# Patient Record
Sex: Female | Born: 1990 | Race: White | Hispanic: No | Marital: Single | Smoking: Current every day smoker
Health system: Southern US, Community
[De-identification: ages and names within clinical notes are randomized; demographics above are authoritative.]

## PROBLEM LIST (undated history)

## (undated) DIAGNOSIS — E669 Obesity, unspecified: Secondary | ICD-10-CM

---
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
## 2008-03-28 ENCOUNTER — Ambulatory Visit: Payer: Self-pay | Admitting: Family Medicine

## 2008-03-28 DIAGNOSIS — F17200 Nicotine dependence, unspecified, uncomplicated: Secondary | ICD-10-CM

## 2008-03-28 DIAGNOSIS — F411 Generalized anxiety disorder: Secondary | ICD-10-CM | POA: Insufficient documentation

## 2008-03-28 DIAGNOSIS — R079 Chest pain, unspecified: Secondary | ICD-10-CM

## 2011-06-07 ENCOUNTER — Emergency Department (HOSPITAL_COMMUNITY)
Admission: EM | Admit: 2011-06-07 | Discharge: 2011-06-08 | Disposition: A | Discharge status: Home / Self Care | Payer: Self-pay | Attending: Emergency Medicine | Admitting: Emergency Medicine

## 2011-06-07 DIAGNOSIS — K089 Disorder of teeth and supporting structures, unspecified: Secondary | ICD-10-CM | POA: Insufficient documentation

## 2011-06-07 DIAGNOSIS — K029 Dental caries, unspecified: Secondary | ICD-10-CM | POA: Insufficient documentation

## 2011-07-23 ENCOUNTER — Emergency Department (HOSPITAL_BASED_OUTPATIENT_CLINIC_OR_DEPARTMENT_OTHER)
Admission: EM | Admit: 2011-07-23 | Discharge: 2011-07-24 | Disposition: A | Discharge status: Home / Self Care | Payer: Self-pay | Attending: Emergency Medicine | Admitting: Emergency Medicine

## 2011-07-23 DIAGNOSIS — E669 Obesity, unspecified: Secondary | ICD-10-CM | POA: Insufficient documentation

## 2011-07-23 DIAGNOSIS — K0889 Other specified disorders of teeth and supporting structures: Secondary | ICD-10-CM

## 2011-07-23 DIAGNOSIS — K089 Disorder of teeth and supporting structures, unspecified: Secondary | ICD-10-CM | POA: Insufficient documentation

## 2011-07-23 DIAGNOSIS — F17200 Nicotine dependence, unspecified, uncomplicated: Secondary | ICD-10-CM | POA: Insufficient documentation

## 2011-07-23 HISTORY — DX: Obesity, unspecified: E66.9

## 2011-07-23 MED ORDER — OXYCODONE-ACETAMINOPHEN 5-325 MG PO TABS: 1.0000 | ORAL_TABLET | ORAL | Status: AC | PRN

## 2011-07-23 MED ORDER — PENICILLIN V POTASSIUM 500 MG PO TABS: 500.0000 mg | ORAL_TABLET | Freq: Four times a day (QID) | ORAL | Status: AC

## 2011-07-23 MED ORDER — OXYCODONE-ACETAMINOPHEN 5-325 MG PO TABS
1.0000 | ORAL_TABLET | Freq: Once | ORAL | Status: AC
  Administered 2011-07-24: 1 via ORAL
  Filled 2011-07-23: qty 1

## 2011-07-23 MED ORDER — IBUPROFEN 400 MG PO TABS
400.0000 mg | ORAL_TABLET | Freq: Once | ORAL | Status: AC
  Administered 2011-07-24: 400 mg via ORAL
  Filled 2011-07-23: qty 1

## 2011-07-23 NOTE — ED Notes (Signed)
Pt states that she has tooth pain from wisdom teeth, on the Left side.  Was seen about this 3 months ago, was given vicodin and abx.  Pt states that she has not been able to see a dentist yet for this pain.

## 2011-07-23 NOTE — ED Provider Notes (Signed)
History    20yF with toothache. L lower molar. Has been bothering for months. Evaluated previously and given pain meds and course of abx. Did not follow up with dentist although recommended because pain improved. Pain in same area now. No fever or chills. No difficulty breathing or swallowing. No n/v.    CSN: [id number] Arrival date & time: 07/23/2011 10:07 PM   First MD Initiated Contact with Patient 07/23/11 2338      Chief Complaint  Patient presents with  . Dental Pain    (Consider location/radiation/quality/duration/timing/severity/associated sxs/prior treatment) HPI  Past Medical History  Diagnosis Date  . Obesity     History reviewed. No pertinent past surgical history.  History reviewed. No pertinent family history.  History  Substance Use Topics  . Smoking status: Current Everyday Smoker -- 0.5 packs/day for 3 years    Types: Cigarettes  . Smokeless tobacco: Never Used  . Alcohol Use: Yes     socially    OB History    Grav Para Term Preterm Abortions TAB SAB Ect Mult Living                  Review of Systems   Review of symptoms negative unless otherwise noted in HPI.   Allergies  Review of patient's allergies indicates no known allergies.  Home Medications   Current Outpatient Rx  Name Route Sig Dispense Refill  . IBUPROFEN 200 MG PO TABS Oral Take 400 mg by mouth every 6 (six) hours as needed. For pain     . ONE-DAILY MULTI VITAMINS PO TABS Oral Take 1 tablet by mouth daily.      . OXYCODONE-ACETAMINOPHEN 5-325 MG PO TABS Oral Take 1 tablet by mouth every 4 (four) hours as needed for pain. 10 tablet 0  . PENICILLIN V POTASSIUM 500 MG PO TABS Oral Take 1 tablet (500 mg total) by mouth 4 (four) times daily. 28 tablet 0    BP 132/82  Pulse 81  Temp(Src) 98.6 F (37 C) (Oral)  Resp 17  Ht 5\' 4"  (1.626 m)  Wt 300 lb (136.079 kg)  BMI 51.49 kg/m2  SpO2 97%  LMP 07/16/2011  Physical Exam  Nursing note and vitals reviewed. Constitutional:  She appears well-developed and well-nourished. No distress.  HENT:  Head: Normocephalic and atraumatic.  Right Ear: External ear normal.  Left Ear: External ear normal.  Mouth/Throat: Oropharynx is clear and moist. No oropharyngeal exudate.       L lower molar partially erupted. Adjacent tooth cracked. No discrete drainable collection. posterior rpharynx normal. Uvula midline. No tirismus. submental tissues soft.  Eyes: Conjunctivae are normal. Pupils are equal, round, and reactive to light. Right eye exhibits no discharge. Left eye exhibits no discharge.  Neck: Normal range of motion. Neck supple.  Cardiovascular: Normal rate, regular rhythm and normal heart sounds.  Exam reveals no gallop and no friction rub.   No murmur heard. Pulmonary/Chest: Effort normal and breath sounds normal. No respiratory distress.  Musculoskeletal: She exhibits no edema and no tenderness.  Lymphadenopathy:    She has no cervical adenopathy.  Neurological: She is alert.  Skin: Skin is warm and dry. She is not diaphoretic.  Psychiatric: She has a normal mood and affect. Her behavior is normal. Thought content normal.    ED Course  Procedures (including critical care time)  Labs Reviewed - No data to display No results found.   1. Pain, dental       MDM  20yF with dental  pain. No evidence of discrete drainable abscess or deep space infection. Handling secretions. Uvula midline. No trismus. Submental tissues soft. Afebrile and nontoxic appearing. Plan prn pain meds, abx and dental fu.           Raeford Razor, MD 07/24/11 [phone]

## 2011-07-24 NOTE — ED Notes (Signed)
Care plan reviewed with follow up

## 2011-07-24 NOTE — ED Notes (Signed)
Care plan and use of Meds reviewed and follow up with dentist

## 2012-02-22 ENCOUNTER — Encounter (HOSPITAL_BASED_OUTPATIENT_CLINIC_OR_DEPARTMENT_OTHER): Payer: Self-pay | Admitting: *Deleted

## 2012-02-22 ENCOUNTER — Emergency Department (HOSPITAL_BASED_OUTPATIENT_CLINIC_OR_DEPARTMENT_OTHER)
Admission: EM | Admit: 2012-02-22 | Discharge: 2012-02-22 | Disposition: A | Discharge status: Home / Self Care | Payer: Self-pay | Attending: Emergency Medicine | Admitting: Emergency Medicine

## 2012-02-22 DIAGNOSIS — J029 Acute pharyngitis, unspecified: Secondary | ICD-10-CM | POA: Insufficient documentation

## 2012-02-22 DIAGNOSIS — K089 Disorder of teeth and supporting structures, unspecified: Secondary | ICD-10-CM | POA: Insufficient documentation

## 2012-02-22 DIAGNOSIS — K0530 Chronic periodontitis, unspecified: Secondary | ICD-10-CM | POA: Insufficient documentation

## 2012-02-22 DIAGNOSIS — R220 Localized swelling, mass and lump, head: Secondary | ICD-10-CM | POA: Insufficient documentation

## 2012-02-22 DIAGNOSIS — F17200 Nicotine dependence, unspecified, uncomplicated: Secondary | ICD-10-CM | POA: Insufficient documentation

## 2012-02-22 DIAGNOSIS — R221 Localized swelling, mass and lump, neck: Secondary | ICD-10-CM | POA: Insufficient documentation

## 2012-02-22 DIAGNOSIS — E669 Obesity, unspecified: Secondary | ICD-10-CM | POA: Insufficient documentation

## 2012-02-22 LAB — RAPID STREP SCREEN (MED CTR MEBANE ONLY): Streptococcus, Group A Screen (Direct): NEGATIVE

## 2012-02-22 MED ORDER — PENICILLIN V POTASSIUM 500 MG PO TABS: 500.0000 mg | ORAL_TABLET | Freq: Four times a day (QID) | ORAL | Status: AC

## 2012-02-22 MED ORDER — PENICILLIN V POTASSIUM 250 MG PO TABS
500.0000 mg | ORAL_TABLET | Freq: Once | ORAL | Status: AC
  Administered 2012-02-22: 500 mg via ORAL
  Filled 2012-02-22: qty 2

## 2012-02-22 MED ORDER — OXYCODONE-ACETAMINOPHEN 5-325 MG PO TABS
1.0000 | ORAL_TABLET | Freq: Once | ORAL | Status: AC
  Administered 2012-02-22: 1 via ORAL
  Filled 2012-02-22: qty 1

## 2012-02-22 MED ORDER — OXYCODONE-ACETAMINOPHEN 5-325 MG PO TABS: 1.0000 | ORAL_TABLET | Freq: Four times a day (QID) | ORAL | Status: AC | PRN

## 2012-02-22 NOTE — ED Provider Notes (Signed)
History     CSN: [id number]  Arrival date & time 02/22/12  2009   First MD Initiated Contact with Patient 02/22/12 2133      Chief Complaint  Patient presents with  . Sore Throat    (Consider location/radiation/quality/duration/timing/severity/associated sxs/prior treatment) HPI Pt with known impacted wisdom tooth on the L lower reports she has had pain and swelling around that tooth for several days/weeks but today she woke up with sore throat, moderate aching pain, worse with swallowing. No associated fever or cough.  Past Medical History  Diagnosis Date  . Obesity     History reviewed. No pertinent past surgical history.  History reviewed. No pertinent family history.  History  Substance Use Topics  . Smoking status: Current Everyday Smoker -- 0.5 packs/day for 3 years    Types: Cigarettes  . Smokeless tobacco: Never Used  . Alcohol Use: Yes     socially    OB History    Grav Para Term Preterm Abortions TAB SAB Ect Mult Living                  Review of Systems All other systems reviewed and are negative except as noted in HPI.   Allergies  Review of patient's allergies indicates no known allergies.  Home Medications   Current Outpatient Rx  Name Route Sig Dispense Refill  . IBUPROFEN 200 MG PO TABS Oral Take 600 mg by mouth every 6 (six) hours as needed. For pain      BP 158/103  Pulse 80  Temp 99 F (37.2 C) (Oral)  Resp 20  Ht 5\' 5"  (1.651 m)  Wt 300 lb (136.079 kg)  BMI 49.92 kg/m2  SpO2 97%  Physical Exam  Nursing note and vitals reviewed. Constitutional: She is oriented to person, place, and time. She appears well-developed and well-nourished.  HENT:  Head: Normocephalic and atraumatic.       Pericoronitis around L lower wisdom tooth, there is mild tonsillar edema, erythema and exudate on that side  Eyes: EOM are normal. Pupils are equal, round, and reactive to light.  Neck: Normal range of motion. Neck supple.  Cardiovascular: Normal  rate, normal heart sounds and intact distal pulses.   Pulmonary/Chest: Effort normal and breath sounds normal.  Abdominal: Bowel sounds are normal. She exhibits no distension. There is no tenderness.  Musculoskeletal: Normal range of motion. She exhibits no edema and no tenderness.  Lymphadenopathy:    She has no cervical adenopathy.  Neurological: She is alert and oriented to person, place, and time. She has normal strength. No cranial nerve deficit or sensory deficit.  Skin: Skin is warm and dry. No rash noted.  Psychiatric: She has a normal mood and affect.    ED Course  Procedures (including critical care time)   Labs Reviewed  RAPID STREP SCREEN   No results found.   No diagnosis found.    MDM  Rapid strep neg, will start PCN for pericoronitis, advised oral surgery followup.         Charles B. Bernette Mayers, MD 02/22/12 2251

## 2012-02-22 NOTE — ED Notes (Signed)
Pt has infected wisdom tooth for weeks and woke this morning with sore throat and swollen tonsils

## 2012-02-23 LAB — STREP A DNA PROBE: Group A Strep Probe: NEGATIVE

## 2012-09-15 ENCOUNTER — Encounter (HOSPITAL_BASED_OUTPATIENT_CLINIC_OR_DEPARTMENT_OTHER): Payer: Self-pay | Admitting: *Deleted

## 2012-09-15 ENCOUNTER — Emergency Department (HOSPITAL_BASED_OUTPATIENT_CLINIC_OR_DEPARTMENT_OTHER)
Admission: EM | Admit: 2012-09-15 | Discharge: 2012-09-15 | Disposition: A | Discharge status: Home / Self Care | Payer: PRIVATE HEALTH INSURANCE | Attending: Emergency Medicine | Admitting: Emergency Medicine

## 2012-09-15 DIAGNOSIS — K089 Disorder of teeth and supporting structures, unspecified: Secondary | ICD-10-CM | POA: Insufficient documentation

## 2012-09-15 DIAGNOSIS — E669 Obesity, unspecified: Secondary | ICD-10-CM | POA: Insufficient documentation

## 2012-09-15 DIAGNOSIS — F17200 Nicotine dependence, unspecified, uncomplicated: Secondary | ICD-10-CM | POA: Insufficient documentation

## 2012-09-15 DIAGNOSIS — K0889 Other specified disorders of teeth and supporting structures: Secondary | ICD-10-CM

## 2012-09-15 MED ORDER — HYDROCODONE-ACETAMINOPHEN 5-325 MG PO TABS
2.0000 | ORAL_TABLET | ORAL | Status: AC | PRN
Start: 2012-09-15 — End: ?

## 2012-09-15 MED ORDER — PENICILLIN V POTASSIUM 250 MG PO TABS: 250.0000 mg | ORAL_TABLET | Freq: Four times a day (QID) | ORAL | Status: AC

## 2012-09-15 NOTE — ED Provider Notes (Signed)
History     CSN: [id number]  Arrival date & time 09/15/12  2012   First MD Initiated Contact with Patient 09/15/12 2029      Chief Complaint  Patient presents with  . Dental Pain    (Consider location/radiation/quality/duration/timing/severity/associated sxs/prior treatment) HPI Comments: Pt states that her left lower tooth broke  Patient is a 22 y.o. female presenting with tooth pain. The history is provided by the patient. No language interpreter was used.  Dental PainPrimary symptoms do not include fever or cough. The symptoms began 12 to 24 hours ago. The symptoms are unchanged. The symptoms occur constantly.    Past Medical History  Diagnosis Date  . Obesity     History reviewed. No pertinent past surgical history.  No family history on file.  History  Substance Use Topics  . Smoking status: Current Every Day Smoker -- 0.5 packs/day for 3 years    Types: Cigarettes  . Smokeless tobacco: Never Used  . Alcohol Use: Yes     Comment: socially    OB History    Grav Para Term Preterm Abortions TAB SAB Ect Mult Living                  Review of Systems  Constitutional: Negative for fever.  Respiratory: Negative for cough.   Cardiovascular: Negative.     Allergies  Review of patient's allergies indicates no known allergies.  Home Medications   Current Outpatient Rx  Name  Route  Sig  Dispense  Refill  . HYDROCODONE-ACETAMINOPHEN 5-325 MG PO TABS   Oral   Take 2 tablets by mouth every 4 (four) hours as needed for pain.   10 tablet   0   . IBUPROFEN 200 MG PO TABS   Oral   Take 600 mg by mouth every 6 (six) hours as needed. For pain         . PENICILLIN V POTASSIUM 250 MG PO TABS   Oral   Take 1 tablet (250 mg total) by mouth 4 (four) times daily.   40 tablet   0     BP 154/91  Pulse 93  Temp 97.5 F (36.4 C) (Oral)  Resp 20  SpO2 97%  Physical Exam  Nursing note and vitals reviewed. Constitutional: She appears well-developed and  well-nourished.  HENT:  Head: Normocephalic and atraumatic.  Left Ear: External ear normal.  Mouth/Throat:    Cardiovascular: Normal rate.   Pulmonary/Chest: Effort normal and breath sounds normal.    ED Course  Procedures (including critical care time)  Labs Reviewed - No data to display No results found.   1. Toothache       MDM  Will treat with pcn and vicodin and refer to dentist        Teressa Lower, NP 09/15/12 2055

## 2012-09-15 NOTE — ED Notes (Signed)
Toothache. States part of her tooth fell out earlier today.

## 2012-09-16 NOTE — ED Provider Notes (Signed)
Medical screening examination/treatment/procedure(s) were performed by non-physician practitioner and as supervising physician I was immediately available for consultation/collaboration.  Geoffery Lyons, MD 09/16/12 [phone]

## 2016-06-21 ENCOUNTER — Encounter (HOSPITAL_BASED_OUTPATIENT_CLINIC_OR_DEPARTMENT_OTHER): Payer: Self-pay | Admitting: *Deleted

## 2016-06-21 ENCOUNTER — Emergency Department (HOSPITAL_BASED_OUTPATIENT_CLINIC_OR_DEPARTMENT_OTHER): Payer: BLUE CROSS/BLUE SHIELD

## 2016-06-21 ENCOUNTER — Emergency Department (HOSPITAL_BASED_OUTPATIENT_CLINIC_OR_DEPARTMENT_OTHER)
Admission: EM | Admit: 2016-06-21 | Discharge: 2016-06-21 | Disposition: A | Discharge status: Home / Self Care | Payer: BLUE CROSS/BLUE SHIELD | Attending: Emergency Medicine | Admitting: Emergency Medicine

## 2016-06-21 DIAGNOSIS — N939 Abnormal uterine and vaginal bleeding, unspecified: Secondary | ICD-10-CM

## 2016-06-21 DIAGNOSIS — F17210 Nicotine dependence, cigarettes, uncomplicated: Secondary | ICD-10-CM | POA: Diagnosis not present

## 2016-06-21 DIAGNOSIS — Z791 Long term (current) use of non-steroidal anti-inflammatories (NSAID): Secondary | ICD-10-CM | POA: Insufficient documentation

## 2016-06-21 DIAGNOSIS — N938 Other specified abnormal uterine and vaginal bleeding: Secondary | ICD-10-CM | POA: Diagnosis not present

## 2016-06-21 LAB — URINE MICROSCOPIC-ADD ON

## 2016-06-21 LAB — URINALYSIS, ROUTINE W REFLEX MICROSCOPIC
Bilirubin Urine: NEGATIVE
Glucose, UA: NEGATIVE mg/dL
Ketones, ur: NEGATIVE mg/dL
NITRITE: NEGATIVE
Protein, ur: NEGATIVE mg/dL
SPECIFIC GRAVITY, URINE: 1.018 (ref 1.005–1.030)
pH: 6.5 (ref 5.0–8.0)

## 2016-06-21 LAB — CBC WITH DIFFERENTIAL/PLATELET
BASOS ABS: 0 10*3/uL (ref 0.0–0.1)
Basophils Relative: 0 %
EOS PCT: 2 %
Eosinophils Absolute: 0.3 10*3/uL (ref 0.0–0.7)
HCT: 45.3 % (ref 36.0–46.0)
Hemoglobin: 14.8 g/dL (ref 12.0–15.0)
LYMPHS PCT: 24 %
Lymphs Abs: 2.8 10*3/uL (ref 0.7–4.0)
MCH: 31 pg (ref 26.0–34.0)
MCHC: 32.7 g/dL (ref 30.0–36.0)
MCV: 95 fL (ref 78.0–100.0)
Monocytes Absolute: 1.2 10*3/uL — ABNORMAL HIGH (ref 0.1–1.0)
Monocytes Relative: 10 %
NEUTROS ABS: 7.7 10*3/uL (ref 1.7–7.7)
Neutrophils Relative %: 64 %
PLATELETS: 500 10*3/uL — AB (ref 150–400)
RBC: 4.77 MIL/uL (ref 3.87–5.11)
RDW: 14.4 % (ref 11.5–15.5)
WBC: 11.9 10*3/uL — AB (ref 4.0–10.5)

## 2016-06-21 LAB — WET PREP, GENITAL
CLUE CELLS WET PREP: NONE SEEN
Sperm: NONE SEEN
Trich, Wet Prep: NONE SEEN
Yeast Wet Prep HPF POC: NONE SEEN

## 2016-06-21 LAB — PREGNANCY, URINE: PREG TEST UR: NEGATIVE

## 2016-06-21 MED ORDER — ACETAMINOPHEN 500 MG PO TABS
1000.0000 mg | ORAL_TABLET | Freq: Once | ORAL | Status: AC
  Administered 2016-06-21: 1000 mg via ORAL
  Filled 2016-06-21: qty 2

## 2016-06-21 MED ORDER — NAPROXEN 500 MG PO TABS: 500.0000 mg | ORAL_TABLET | Freq: Two times a day (BID) | ORAL | 0 refills | Status: AC

## 2016-06-21 NOTE — ED Triage Notes (Signed)
She started a new BC 5 weeks ago. She had vaginal bleeding 3 weeks into the pills that has not stopped. Yesterday she had back and abdominal cramps, heavy vaginal bleeding and abdominal pain. [facility] mid cycle per her doctor.

## 2016-06-21 NOTE — ED Provider Notes (Signed)
[facility] Provider [facility]   CSN: [id number] Arrival date & time: 06/21/16  1229     History   Chief Complaint Chief Complaint  Patient presents with  . Abdominal Pain  . Vaginal Bleeding    HPI Sherri Hubbard is a 25 y.o. female.  HPI Sherri Hubbard is a 25 y.o. female with PMH significant for obesity, anxiety, and IBS who presents with Vaginal bleeding with associated bilateral abdominal cramping and low back pain. Patient states she started an extended cycle oral contraceptive 5 weeks ago. She then began experiencing bleeding for the past 2 weeks. She states it initially started off light, but over the past week it has become worse. She states she's been using super tampons and having to change them every hour or 2 because they are soaked through. She also states she is noticed clots passing as well. She states her last period was 7 weeks ago. She is sexually active. She denies any abnormal vaginal discharge, urinary symptoms, nausea, vomiting, diarrhea, or fever. She denies any chest pain, shortness of breath, syncope, or dizziness. She took ibuprofen this morning about 9 AM which helped with some of her cramps. She has never had a pelvic ultrasound. She does have a gynecologist.  Past Medical History:  Diagnosis Date  . Obesity     Patient Active Problem List   Diagnosis Date Noted  . ANXIETY 03/28/2008  . TOBACCO USE 03/28/2008  . CHEST PAIN 03/28/2008  . OBESITY 04/19/2007  . IBS 04/19/2007    History reviewed. No pertinent surgical history.  OB History    No data available       Home Medications    Prior to Admission medications   Medication Sig Start Date End Date Taking? Authorizing Provider  ibuprofen (ADVIL,MOTRIN) 200 MG tablet Take 600 mg by mouth every 6 (six) hours as needed. For pain   Yes Historical Provider, MD  HYDROcodone-acetaminophen (NORCO/VICODIN) 5-325 MG per tablet Take 2 tablets by mouth every 4 (four) hours as needed for  pain. 09/15/12   Teressa Lower, NP  naproxen (NAPROSYN) 500 MG tablet Take 1 tablet (500 mg total) by mouth 2 (two) times daily. 06/21/16   Cheri Fowler, PA-C    Family History No family history on file.  Social History Social History  Substance Use Topics  . Smoking status: Current Every Day Smoker    Packs/day: 0.50    Years: 3.00    Types: Cigarettes  . Smokeless tobacco: Never Used  . Alcohol use Yes     Comment: socially     Allergies   Patient has no known allergies.   Review of Systems Review of Systems All other systems negative unless otherwise stated in HPI   Physical Exam Updated Vital Signs BP 117/76 (BP Location: Right Arm)   Pulse 89   Temp 98.3 F (36.8 C) (Oral)   Resp 16   Ht 5\' 5"  (1.651 m)   Wt (!) 174.6 kg   SpO2 98%   BMI 64.07 kg/m   Physical Exam  Constitutional: She is oriented to person, place, and time. She appears well-developed and well-nourished.  Non-toxic appearance. She does not have a sickly appearance. She does not appear ill.  HENT:  Head: Normocephalic and atraumatic.  Mouth/Throat: Oropharynx is clear and moist.  Eyes: Conjunctivae are normal.  Neck: Normal range of motion. Neck supple.  Cardiovascular: Normal rate and regular rhythm.   Pulmonary/Chest: Effort normal and breath sounds normal. No accessory muscle  usage or stridor. No respiratory distress. She has no wheezes. She has no rhonchi. She has no rales.  Abdominal: Soft. Bowel sounds are normal. She exhibits no distension. There is no tenderness. There is no rebound and no guarding.  Genitourinary: No vaginal discharge found.  Genitourinary Comments: Chaperone present during exam. Moderate bleeding in the vaginal vault with clots. No CMT or adnexal tenderness. Cervical os closed. No cervical discharge.  Musculoskeletal: Normal range of motion.  Lymphadenopathy:    She has no cervical adenopathy.  Neurological: She is alert and oriented to person, place, and time.    Speech clear without dysarthria.  Skin: Skin is warm and dry.  Psychiatric: She has a normal mood and affect. Her behavior is normal.     ED Treatments / Results  Labs (all labs ordered are listed, but only abnormal results are displayed) Labs Reviewed  WET PREP, GENITAL - Abnormal; Notable for the following:       Result Value   WBC, Wet Prep HPF POC MODERATE (*)    All other components within normal limits  URINALYSIS, ROUTINE W REFLEX MICROSCOPIC (NOT [facility]) - Abnormal; Notable for the following:    Hgb urine dipstick LARGE (*)    Leukocytes, UA TRACE (*)    All other components within normal limits  CBC WITH DIFFERENTIAL/PLATELET - Abnormal; Notable for the following:    WBC 11.9 (*)    Platelets 500 (*)    Monocytes Absolute 1.2 (*)    All other components within normal limits  URINE MICROSCOPIC-ADD ON - Abnormal; Notable for the following:    Squamous Epithelial / LPF 0-5 (*)    Bacteria, UA RARE (*)    All other components within normal limits  PREGNANCY, URINE  RPR  HIV ANTIBODY (ROUTINE TESTING)  GC/CHLAMYDIA PROBE AMP ([place]) NOT AT [facility]    EKG  EKG Interpretation None       Radiology [place] Transvaginal Non-ob  Result Date: 06/21/2016 CLINICAL DATA:  Vaginal bleeding since beginning a new birth control pill. Bleeding has continued 2 weeks after discontinuing the new birth control pill. EXAM: TRANSABDOMINAL AND TRANSVAGINAL ULTRASOUND OF PELVIS TECHNIQUE: Both transabdominal and transvaginal ultrasound examinations of the pelvis were performed. Transabdominal technique was performed for global imaging of the pelvis including uterus, ovaries, adnexal regions, and pelvic cul-de-sac. It was necessary to proceed with endovaginal exam following the transabdominal exam to visualize the uterus, endometrium, and ovaries. COMPARISON:  None in PACs FINDINGS: Uterus Measurements: 8.6 x 4.2 x 4.4 mm. No fibroids or other mass visualized. Endometrium Thickness: 1.2 cm.  No endometrial mass or fluid collection is observed. Right ovary Measurements: 5 x 3 x 4 cm as best as can be determined. The ovary was not well visualized. Left ovary Measurements: 3 x 2.4 x 2.3 cm as best as can be determined.The ovarian tissue was not well demonstrated. Other findings No abnormal free fluid. IMPRESSION: Next item normal uterine contour in size. No endometrial mass is observed. The endometrial stripe measures 12 mm in thickness. If bleeding remains unresponsive to hormonal or medical therapy, sonohysterogram should be considered for focal lesion work-up. (Ref: Radiological Reasoning: Algorithmic Workup of Abnormal Vaginal Bleeding with Endovaginal Sonography and Sonohysterography. AJR 2008; 161:W96-04; 191:S68-73) Limited visualization of the ovaries. No definite adnexal masses. No free pelvic fluid. Electronically Signed   By: David  [place] M.D.   On: 06/21/2016 15:42   [place] Pelvis Complete  Result Date: 06/21/2016 CLINICAL DATA:  Vaginal bleeding since beginning a new  birth control pill. Bleeding has continued 2 weeks after discontinuing the new birth control pill. EXAM: TRANSABDOMINAL AND TRANSVAGINAL ULTRASOUND OF PELVIS TECHNIQUE: Both transabdominal and transvaginal ultrasound examinations of the pelvis were performed. Transabdominal technique was performed for global imaging of the pelvis including uterus, ovaries, adnexal regions, and pelvic cul-de-sac. It was necessary to proceed with endovaginal exam following the transabdominal exam to visualize the uterus, endometrium, and ovaries. COMPARISON:  None in PACs FINDINGS: Uterus Measurements: 8.6 x 4.2 x 4.4 mm. No fibroids or other mass visualized. Endometrium Thickness: 1.2 cm. No endometrial mass or fluid collection is observed. Right ovary Measurements: 5 x 3 x 4 cm as best as can be determined. The ovary was not well visualized. Left ovary Measurements: 3 x 2.4 x 2.3 cm as best as can be determined.The ovarian tissue was not well demonstrated.  Other findings No abnormal free fluid. IMPRESSION: Next item normal uterine contour in size. No endometrial mass is observed. The endometrial stripe measures 12 mm in thickness. If bleeding remains unresponsive to hormonal or medical therapy, sonohysterogram should be considered for focal lesion work-up. (Ref: Radiological Reasoning: Algorithmic Workup of Abnormal Vaginal Bleeding with Endovaginal Sonography and Sonohysterography. AJR 2008; 161:W96-04; 191:S68-73) Limited visualization of the ovaries. No definite adnexal masses. No free pelvic fluid. Electronically Signed   By: David  [place] M.D.   On: 06/21/2016 15:42    Procedures Procedures (including critical care time)  Medications Ordered in ED Medications  acetaminophen (TYLENOL) tablet 1,000 mg (1,000 mg Oral Given 06/21/16 1404)     Initial Impression / Assessment and Plan / ED Course  I have reviewed the triage vital signs and the nursing notes.  Pertinent labs & imaging results that were available during my care of the patient were reviewed by me and considered in my medical decision making (see chart for details).  Clinical Course    Patient presents with vaginal bleeding in the setting of new oral contraceptives. No syncope, lightheadedness, or dizziness. Vitals reassuring. Hemoglobin stable. Moderate bleeding with clots within the vaginal vault. Cervical os closed. Pelvic ultrasound obtained for further evaluation due to the amount of bleeding. This was largely unremarkable, mildly thickened endometrium. Patient will call her gynecologist tomorrow. Return precautions discussed. Stable for discharge.   Final Clinical Impressions(s) / ED Diagnoses   Final diagnoses:  Vaginal bleeding  Dysfunctional uterine bleeding    New Prescriptions Discharge Medication List as of 06/21/2016  4:19 PM       Cheri FowlerKayla Melannie Metzner, PA-C 06/21/16 1652    Azalia BilisKevin Campos, MD 06/22/16 [phone]

## 2016-06-21 NOTE — Discharge Instructions (Signed)
Call your gynecologist tomorrow to schedule a visit this week for further evaluation.  Take Naproxen or Motrin for your cramping.  Return to the ED if you experience fever, lightheadedness, dizziness, faint, or any new or concerning symptoms.

## 2016-06-22 LAB — GC/CHLAMYDIA PROBE AMP (~~LOC~~) NOT AT ARMC
Chlamydia: NEGATIVE
Neisseria Gonorrhea: NEGATIVE

## 2016-06-22 LAB — RPR: RPR Ser Ql: NONREACTIVE

## 2016-06-22 LAB — HIV ANTIBODY (ROUTINE TESTING W REFLEX): HIV SCREEN 4TH GENERATION: NONREACTIVE

## 2018-04-16 IMAGING — US US PELVIS COMPLETE
1 series · 13 of 25 positions shown · non-contrast
Comparison: None in PACs

CLINICAL DATA: Vaginal bleeding since beginning a new birth control
pill. Bleeding has continued 2 weeks after discontinuing the new
birth control pill.



[Series 1: us pelvis complete · 0.34mm/px · 13 of 38 slices shown]
[im 1/38]
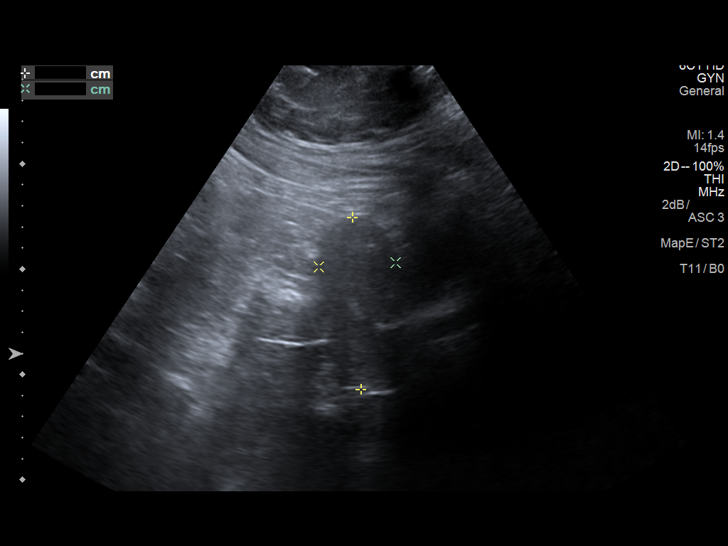
[im 4/38]
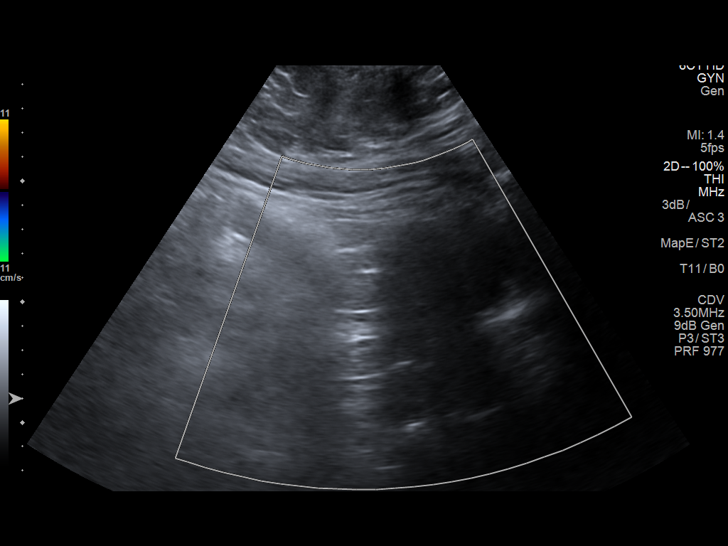
[im 7/38]
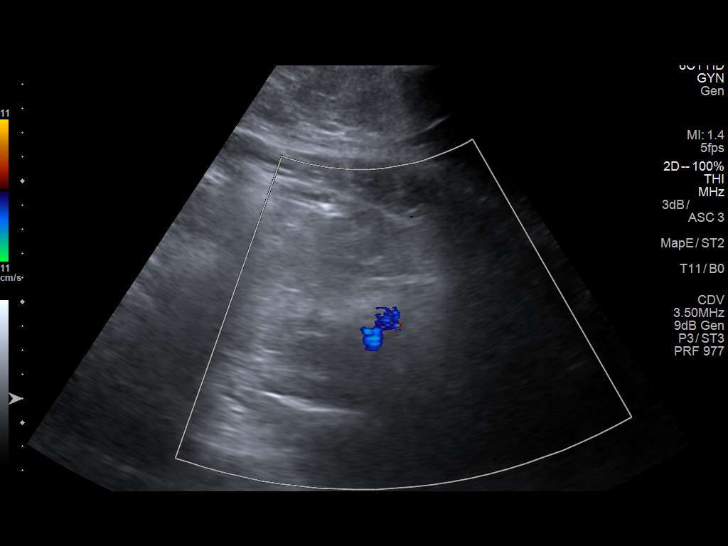
[im 10/38]
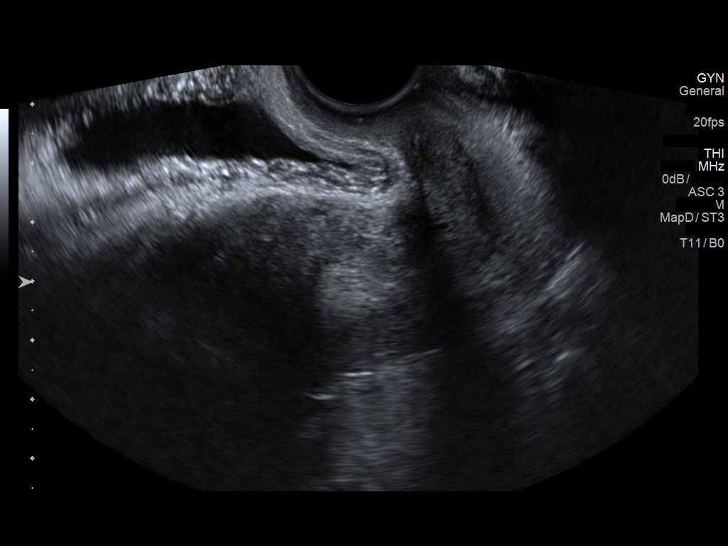
[im 13/38]
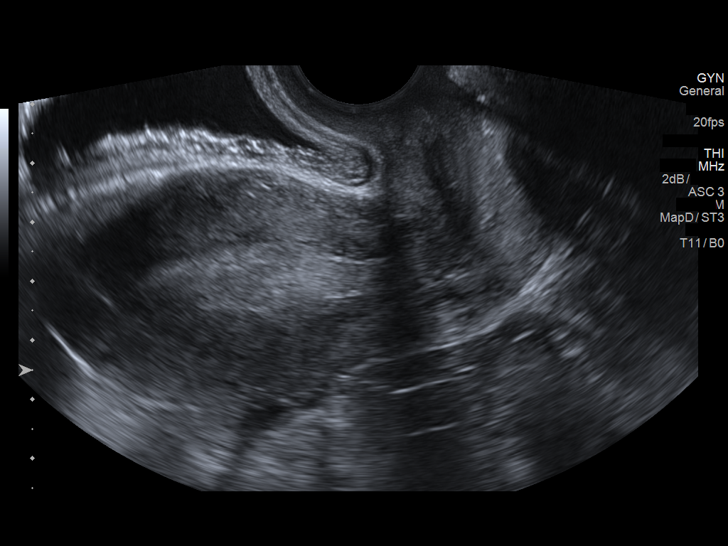
[im 16/38]
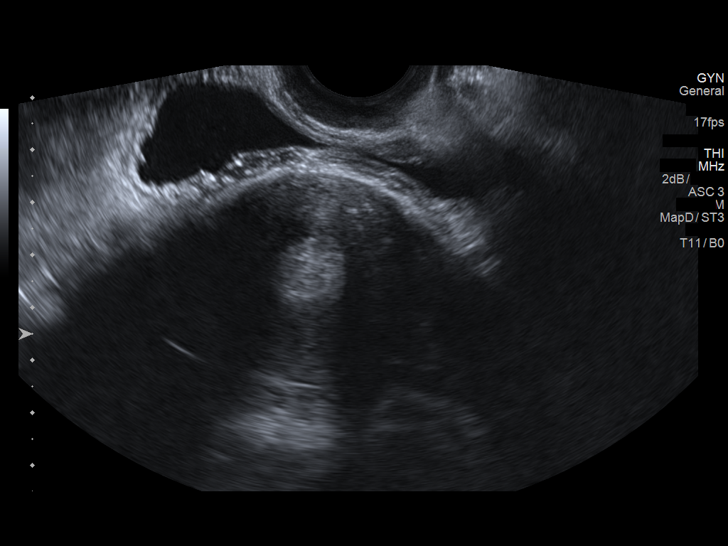
[im 19/38]
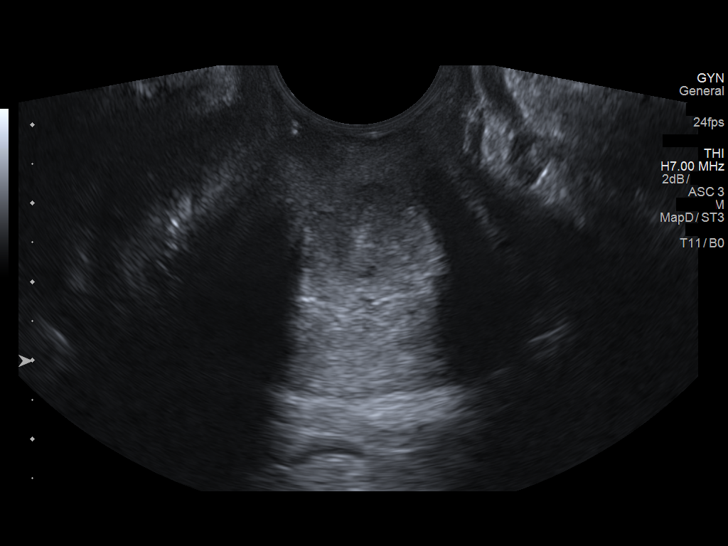
[im 22/38]
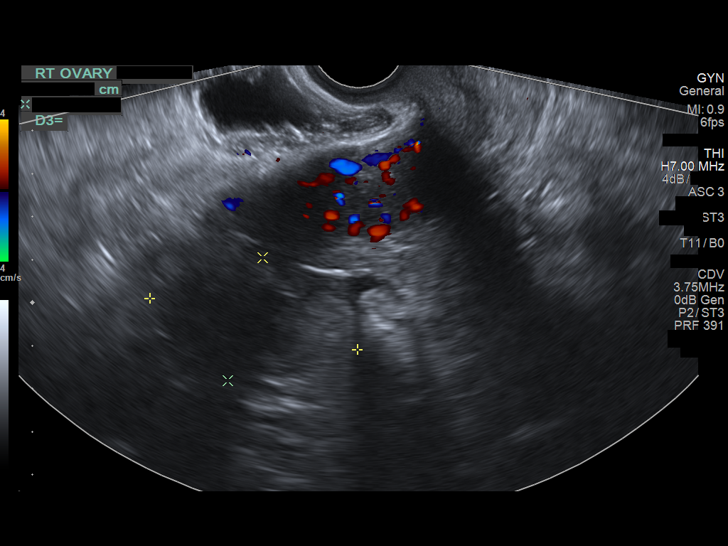
[im 25/38]
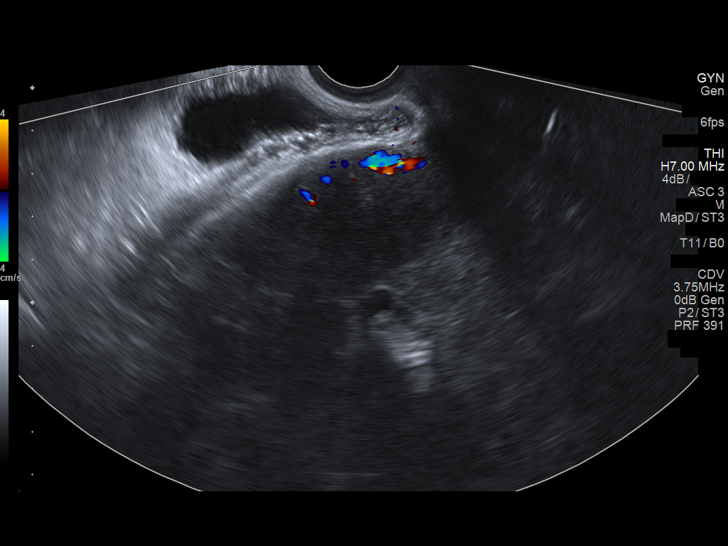
[im 28/38]
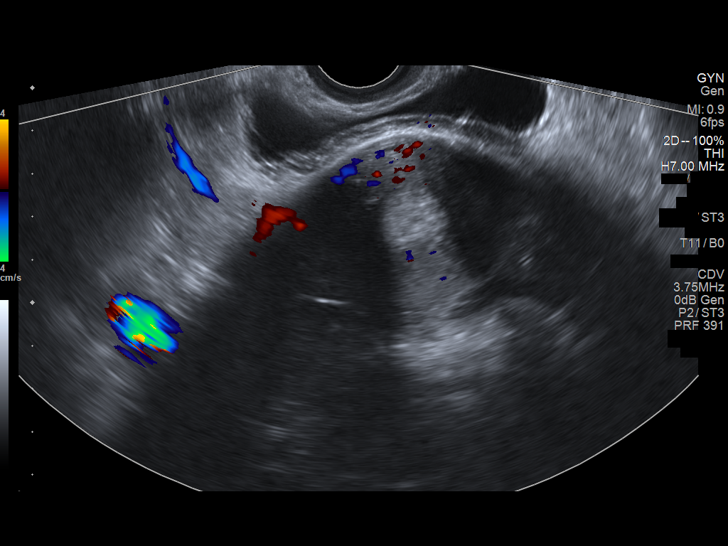
[im 31/38]
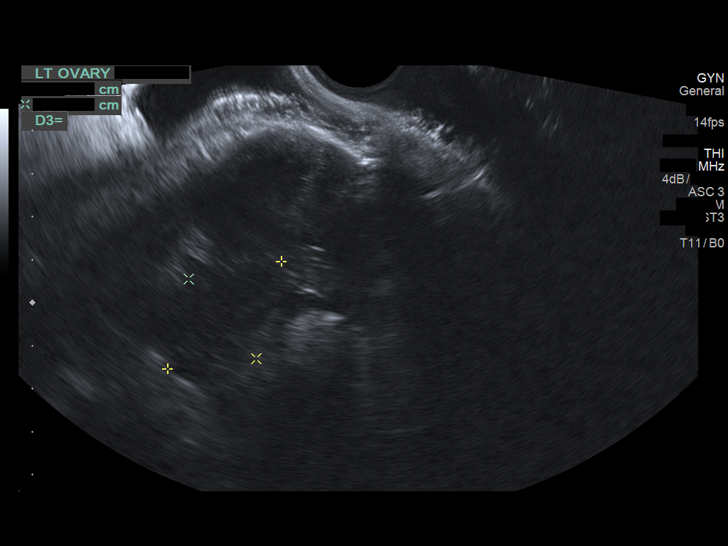
[im 34/38]
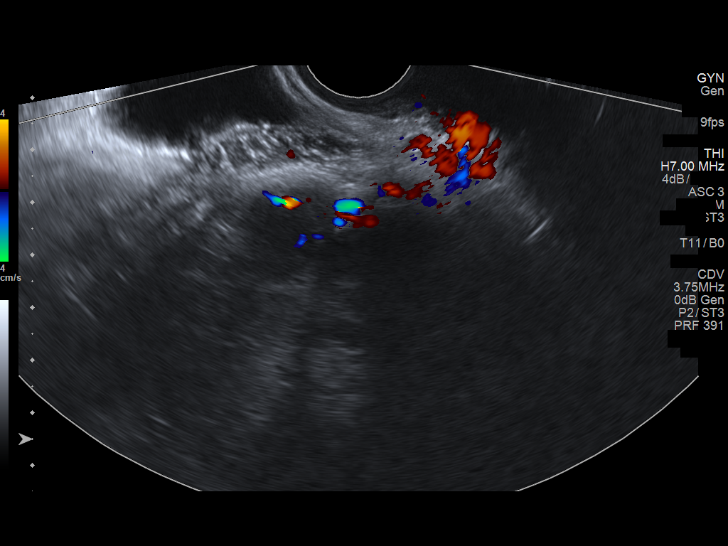
[im 38/38]
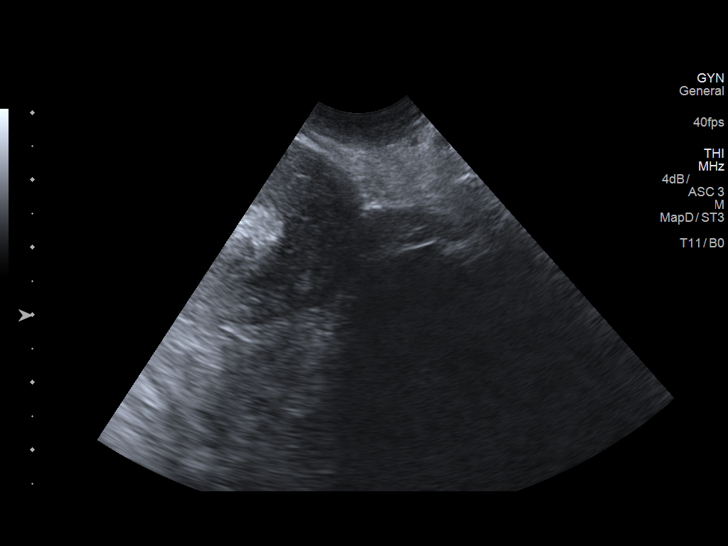

[13 of 25 positions shown; findings below may reference images not displayed]

FINDINGS: Uterus

Measurements: 8.6 x 4.2 x 4.4 mm. No fibroids or other mass
visualized.

Endometrium

Thickness: 1.2 cm. No endometrial mass or fluid collection is
observed.

Right ovary

Measurements: 5 x 3 x 4 cm as best as can be determined.. The ovary
was not well visualized.

Left ovary

Measurements: 3 x 2.4 x 2.3 cm as best as can be determined.The
ovarian tissue was not well demonstrated.

Other findings

No abnormal free fluid.
IMPRESSION: Next item normal uterine contour in size. No endometrial mass is
observed. The endometrial stripe measures 12 mm in thickness. If
bleeding remains unresponsive to hormonal or medical therapy,
sonohysterogram should be considered for focal lesion work-up. (Ref:
Radiological Reasoning: Algorithmic Workup of Abnormal Vaginal
Bleeding with Endovaginal Sonography and Sonohysterography. AJR
5992; 191:S68-73)

Limited visualization of the ovaries. No definite adnexal masses. No
free pelvic fluid.
